# Patient Record
Sex: Female | Born: 1980 | Race: Black or African American | Hispanic: No | Marital: Single
Health system: Midwestern US, Community
[De-identification: ages and names within clinical notes are randomized; demographics above are authoritative.]

## PROBLEM LIST (undated history)

## (undated) DIAGNOSIS — O4700 False labor before 37 completed weeks of gestation, unspecified trimester: Secondary | ICD-10-CM

## (undated) DIAGNOSIS — B009 Herpesviral infection, unspecified: Secondary | ICD-10-CM

## (undated) DIAGNOSIS — A539 Syphilis, unspecified: Secondary | ICD-10-CM

## (undated) DIAGNOSIS — M25561 Pain in right knee: Secondary | ICD-10-CM

## (undated) HISTORY — PX: TUBAL LIGATION: SHX77

## (undated) HISTORY — PX: CARPAL TUNNEL RELEASE: SHX101

---

## 2004-02-29 ENCOUNTER — Emergency Department (HOSPITAL_COMMUNITY): Admission: EM | Admit: 2004-02-29 | Discharge: 2004-02-29 | Payer: Self-pay | Admitting: Emergency Medicine

## 2005-08-18 ENCOUNTER — Emergency Department (HOSPITAL_COMMUNITY): Admission: EM | Admit: 2005-08-18 | Discharge: 2005-08-18 | Payer: Self-pay | Admitting: Family Medicine

## 2005-09-25 ENCOUNTER — Emergency Department (HOSPITAL_COMMUNITY): Admission: EM | Admit: 2005-09-25 | Discharge: 2005-09-25 | Payer: Self-pay | Admitting: Family Medicine

## 2006-05-22 ENCOUNTER — Emergency Department (HOSPITAL_COMMUNITY): Admission: EM | Admit: 2006-05-22 | Discharge: 2006-05-22 | Payer: Self-pay | Admitting: Family Medicine

## 2006-08-11 ENCOUNTER — Emergency Department (HOSPITAL_COMMUNITY): Admission: EM | Admit: 2006-08-11 | Discharge: 2006-08-11 | Payer: Self-pay | Admitting: Family Medicine

## 2006-08-18 ENCOUNTER — Emergency Department (HOSPITAL_COMMUNITY): Admission: EM | Admit: 2006-08-18 | Discharge: 2006-08-18 | Payer: Self-pay | Admitting: Family Medicine

## 2006-10-07 ENCOUNTER — Emergency Department (HOSPITAL_COMMUNITY): Admission: EM | Admit: 2006-10-07 | Discharge: 2006-10-08 | Payer: Self-pay | Admitting: Emergency Medicine

## 2007-04-15 ENCOUNTER — Inpatient Hospital Stay (HOSPITAL_COMMUNITY): Admission: AD | Admit: 2007-04-15 | Discharge: 2007-04-15 | Payer: Self-pay | Admitting: Obstetrics

## 2007-05-29 ENCOUNTER — Inpatient Hospital Stay (HOSPITAL_COMMUNITY): Admission: RE | Admit: 2007-05-29 | Discharge: 2007-05-31 | Payer: Self-pay | Admitting: Obstetrics

## 2008-02-05 IMAGING — US US OB COMP LESS 14 WK
1 series · 14 of 28 positions shown · non-contrast
Comparison: None.

CLINICAL DATA: Abdominal pain and positive pregnancy test.
 OBSTETRICAL ULTRASOUND <14 WKS AND TRANSVAGINAL OB US:
TECHNIQUE: Both transabdominal and transvaginal ultrasound examinations were performed for complete evaluation of the gestation as well as the maternal uterus, adnexal regions, and pelvic cul-de-sac.

[Series 1: unknown · 0.32mm/px · 14 of 42 slices shown]
[im 2/42]
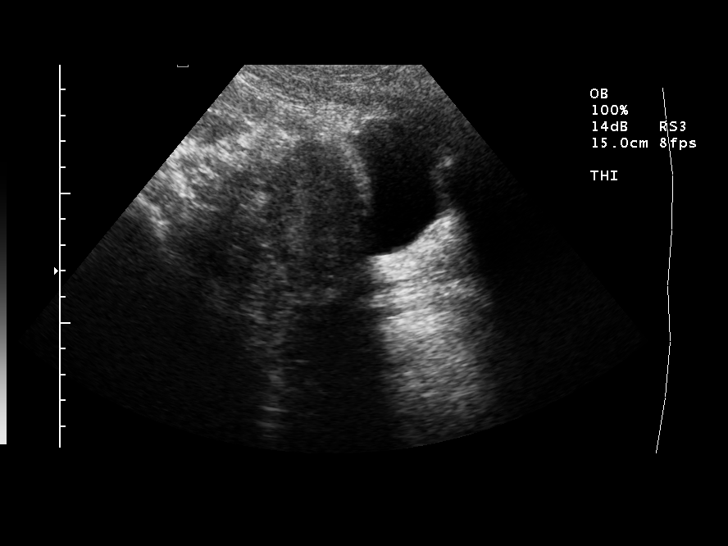
[im 5/42]
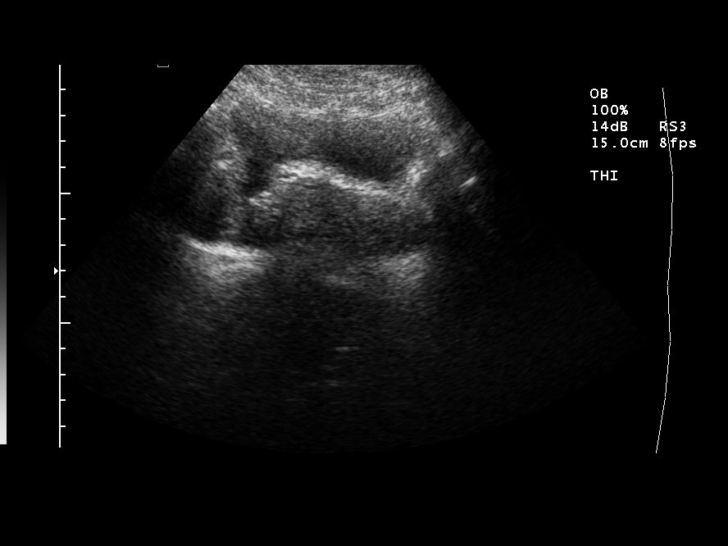
[im 8/42]
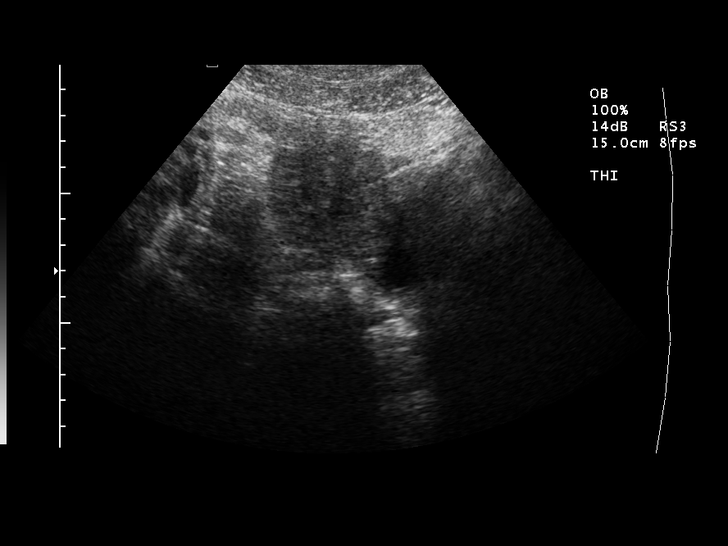
[im 11/42]
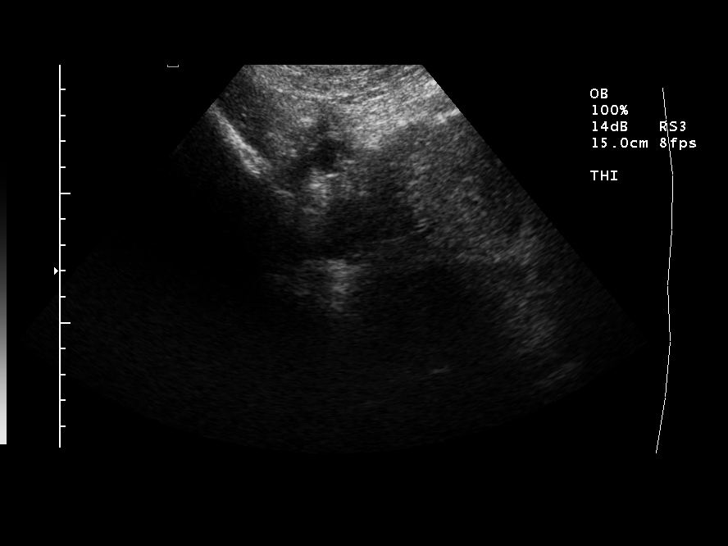
[im 14/42]
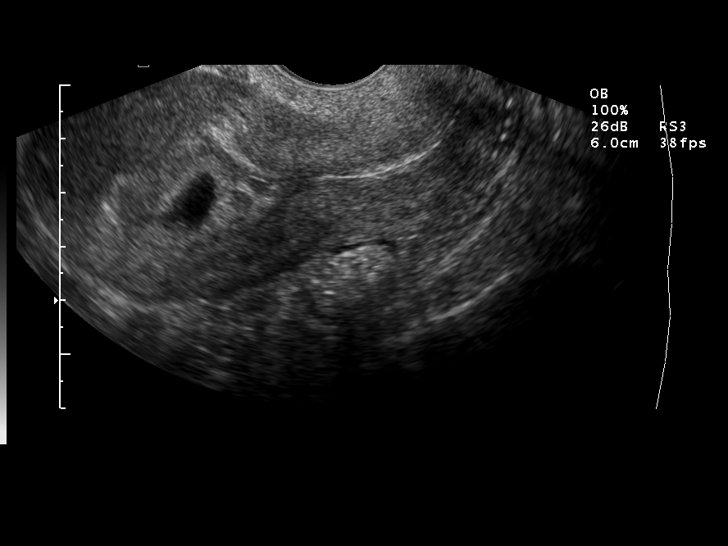
[im 17/42]
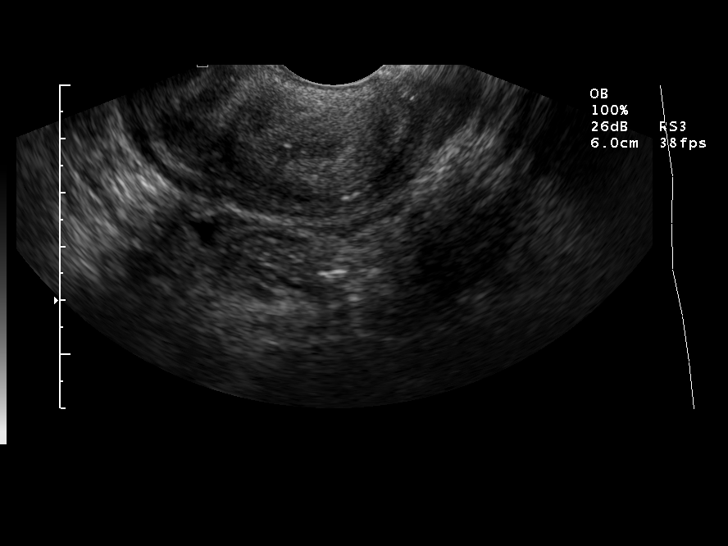
[im 20/42]
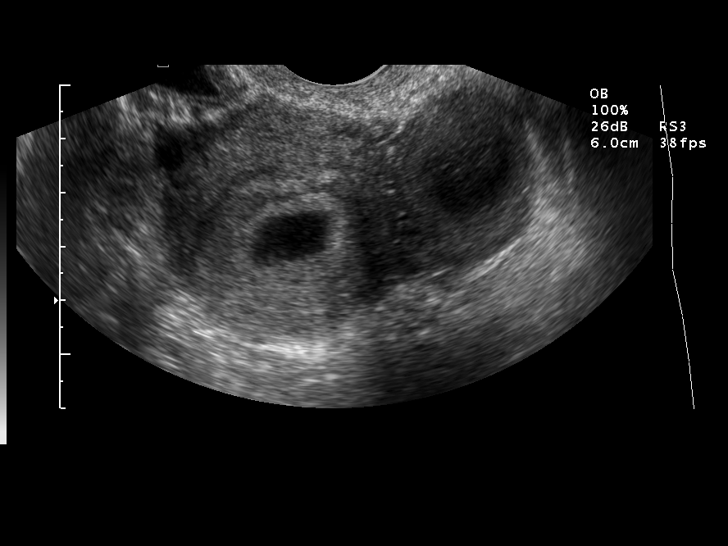
[im 23/42]
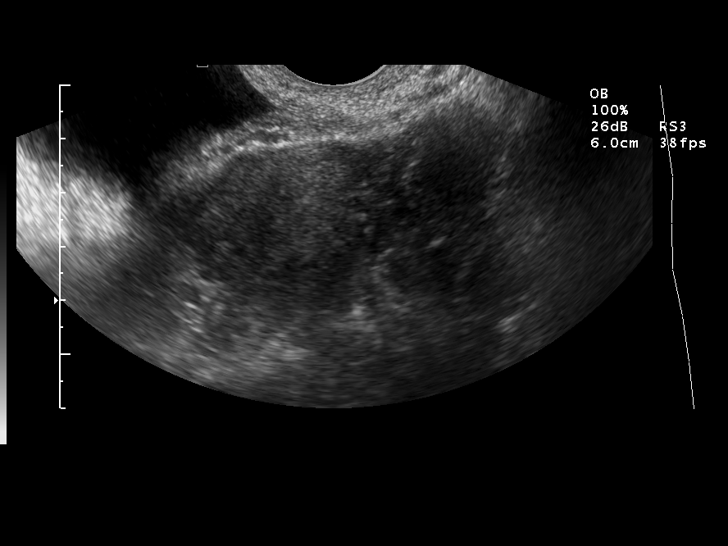
[im 26/42]
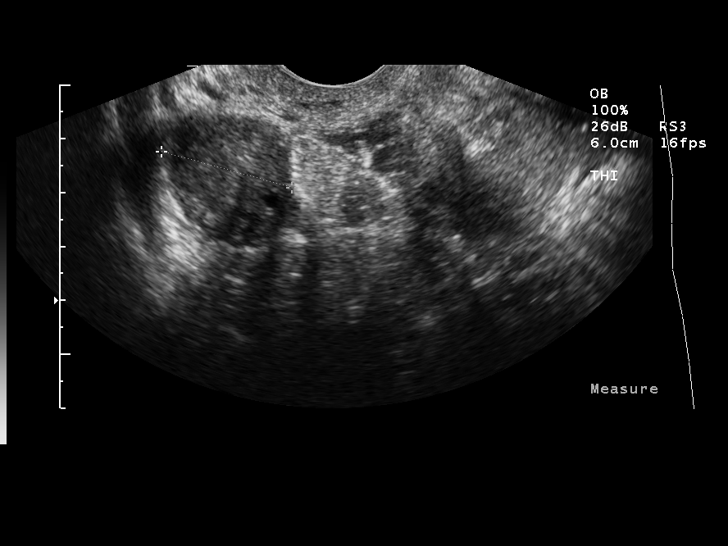
[im 29/42]
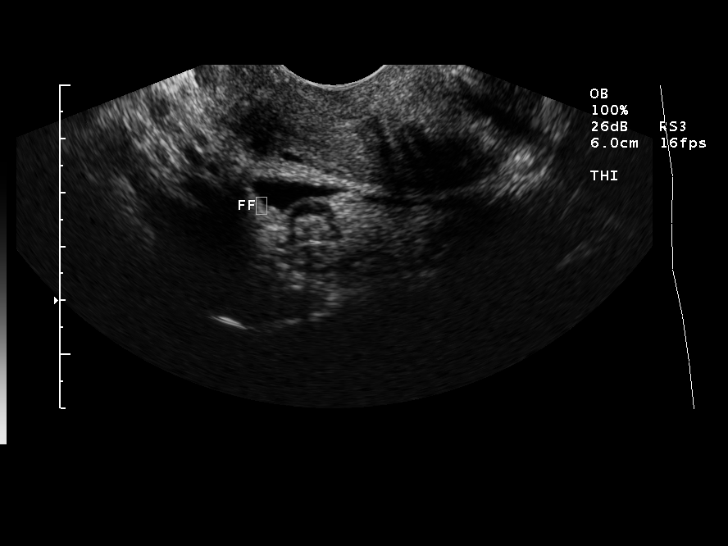
[im 32/42]
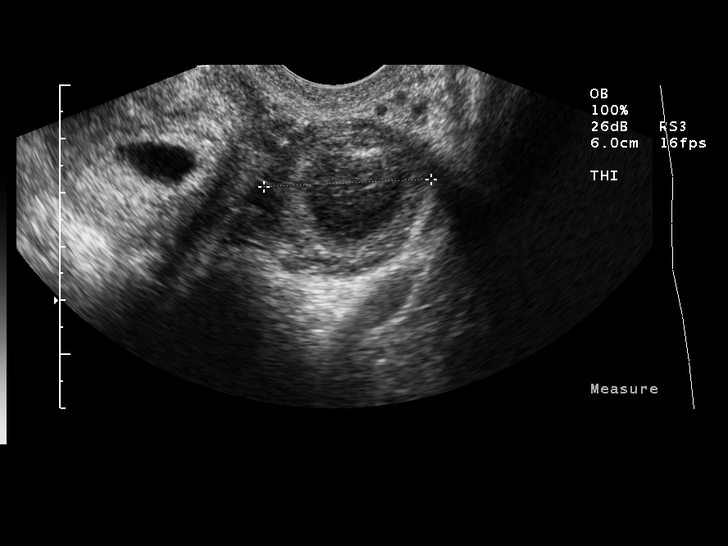
[im 35/42]
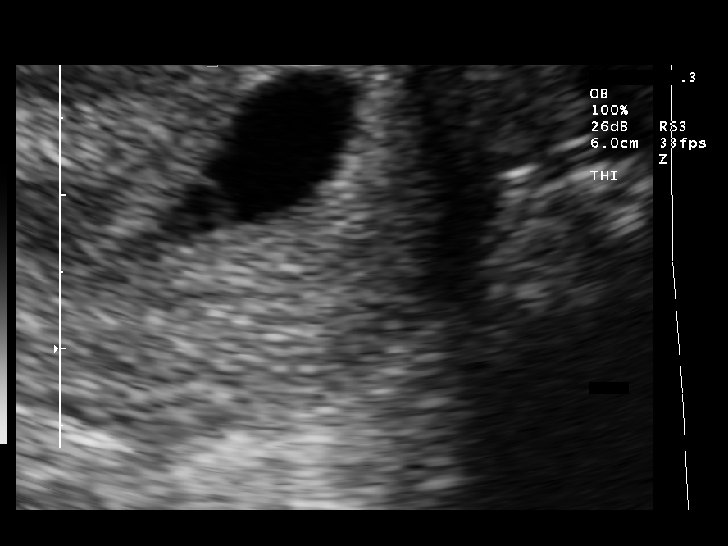
[im 38/42]
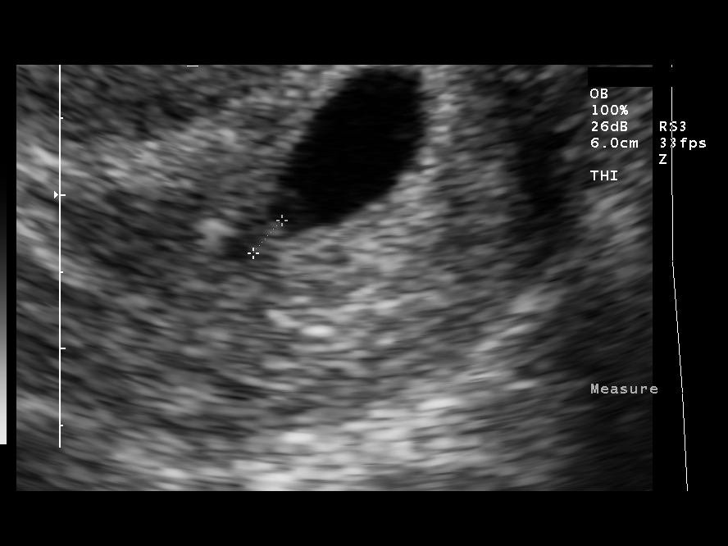
[im 42/42]
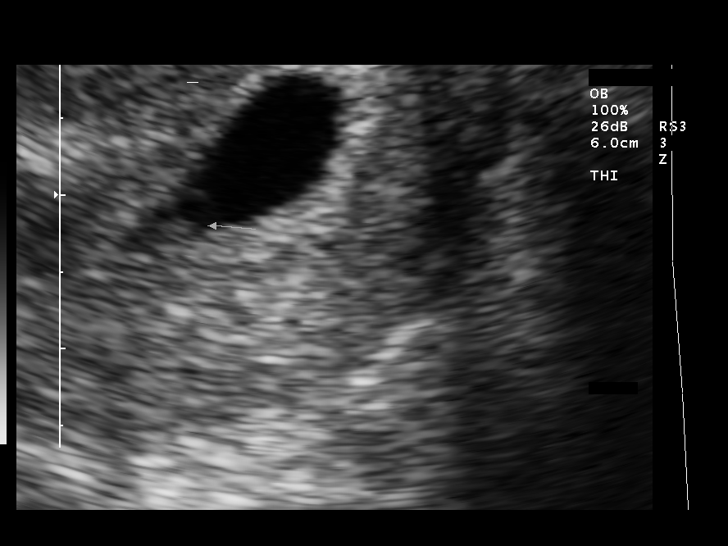

[14 of 28 positions shown; findings below may reference images not displayed]

FINDINGS: A single intrauterine gestational sac is identified.  A yolk sac and embryo are apparent.  Embryonic heart rate is measured at 111 bpm.  Crown-rump length measures 2.9 mm, which estimates a 5 week 6 day gestational age.  No definite evidence for subchorionic hemorrhage.  
 The right ovary is unremarkable.  A 2.5 cm complex cystic lesion is identified in the left ovary, probably a hemorrhagic cyst or endometrioma.  The patient does have a small amount of fluid in the cul-de-sac.
IMPRESSION: 1.  Single living intrauterine gestation at 5 week 6 estimated gestational age by crown-rump length.  
 2.  2.5 cm cystic lesion in the left ovary.  Follow-up ultrasound in 6-8 weeks is recommended to reevaluate.

## 2008-06-09 LAB — CULTURE, WOUND W GRAM STAIN

## 2008-06-11 LAB — CULTURE, WOUND W GRAM STAIN: Culture result:: NORMAL

## 2009-02-03 MED ORDER — ERYTHROMYCIN 5 MG/G EYE OINTMENT
5 mg/gram (0. %) | Freq: Three times a day (TID) | OPHTHALMIC | Status: AC
Start: 2009-02-03 — End: 2009-02-13

## 2009-02-03 NOTE — ED Notes (Signed)
Patient states swelling started yesterday, no other symptoms. This morning R eyelid swelling, burning, itching, and painful.

## 2009-02-03 NOTE — ED Notes (Signed)
Patient states she has allergies and often wakes in the morning with her eyes caked shut.

## 2009-02-03 NOTE — ED Provider Notes (Signed)
Patient is a 28 y.o. female presenting with eye pain. The history is provided by the patient (Woke up this morning and right eye swollen and sore.  Denies injury.). No language interpreter was used.   Eye Pain   This is a new problem. The current episode started 1 to 2 hours ago. The problem occurs constantly. The problem has not changed since onset. There is pain in the right eye. The injury mechanism was none. The pain is at a severity of 0/10. The patient is experiencing no pain. There is no history of trauma to the eye. There is no known exposure to pink eye. She does not wear contacts. Pertinent negatives include no numbness, no blurred vision, no decreased vision, no discharge, no double vision, no foreign body sensation, no photophobia, no eye redness, no nausea, no vomiting, no tingling, no weakness, no itching, no fever, no pain, no blindness, no Head Injury and no dizziness. She has tried nothing for the symptoms.        Past Medical History   Diagnosis Date   ??? Psychiatric disorder           No past surgical history on file.      No family history on file.     History   Social History   ??? Marital Status: Single     Spouse Name: N/A     Number of Children: N/A   ??? Years of Education: N/A   Occupational History   ??? Not on file.   Social History Main Topics   ??? Tobacco Use: Never   ??? Alcohol Use: No   ??? Drug Use: Yes   ??? Sexually Active:    Other Topics Concern   ??? Not on file   Social History Narrative   ??? No narrative on file           ALLERGIES: Review of patient's allergies indicates not on file.      Review of Systems   Constitutional: Negative for fever.   Eyes: Negative for blindness, blurred vision, double vision, photophobia, pain, discharge and redness.   Gastrointestinal: Negative for nausea and vomiting.   Skin: Negative for itching.   Neurological: Negative for dizziness, tingling, weakness and numbness.   All other systems reviewed and are negative.        Filed Vitals:    02/03/2009  7:39 AM    BP: 137/88   Pulse: 78   Temp: 96.5 ??F (35.8 ??C)   Resp: 16   Height: 5\' 4"  (1.626 m)   SpO2: 99%              Physical Exam   Nursing note and vitals reviewed.  Constitutional: She is oriented to person, place, and time. She appears well-developed and well-nourished. No distress.   HENT:   Head: Normocephalic and atraumatic.   Right Ear: External ear normal.   Left Ear: External ear normal.   Mouth/Throat: Oropharynx is clear and moist. No oropharyngeal exudate.   Eyes: Conjunctivae and extraocular motions are normal. Pupils are equal, round, and reactive to light. Right eye exhibits no discharge. Left eye exhibits no discharge. No scleral icterus.        Right eye;  Mild edema medial aspect upper lid with stye formation present without discharge, exudate, tenderness.   Neck: Normal range of motion. Neck supple. No JVD present. No tracheal deviation present. No thyromegaly present.   Cardiovascular: Normal rate, regular rhythm and normal heart sounds.  No murmur heard.  Pulmonary/Chest: Effort normal and breath sounds normal. No respiratory distress. She has no wheezes. She has no rales. She exhibits no tenderness.   Abdominal: Soft. Bowel sounds are normal. She exhibits no distension and no mass. No tenderness. She has no rebound and no guarding.   Musculoskeletal: Normal range of motion. She exhibits no edema and no tenderness.   Lymphadenopathy:     She has no cervical adenopathy.   Neurological: She is alert and oriented to person, place, and time. No cranial nerve deficit. Coordination normal.   Skin: Skin is warm. No rash noted. She is not diaphoretic. No erythema. No pallor.   Psychiatric: She has a normal mood and affect. Her behavior is normal. Judgment and thought content normal.        Coding    Procedures

## 2009-02-03 NOTE — ED Notes (Signed)
Patient states understanding of discharge instructions. Patient was ambulatory upon discharge. Patient received one prescription.

## 2009-02-03 NOTE — ED Notes (Signed)
Per patient, right eye burning, itching and swelling for an unknown period of time

## 2009-03-15 ENCOUNTER — Encounter

## 2009-03-15 NOTE — Progress Notes (Signed)
[facility]  [address]  [place], [place]  [address]    OUTPATIENT physical Therapy  [x]        Initial Evaluation []       30 day/10th visit progress note []       90 day/re-certification    NAME: Sarah Hood AGE: 28 y.o.  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 She was previously working for [employer]. Most of her jobs include lifting and bending-which aggravate her LBP.  She was workig at [employer] until 1 month ago when she started having problems with her R knee. Present Symptoms: Functional Deficits and Limitations:   [x]       Sitting:30 min.   [x]      Dressing:Her daughter  []      Reaching:  [x]       Standing:30 min.   []       Bathing:   []      Lifting:  []       Walking:   [x]       Cooking:   []      Yardwork:  [x]       Sleeping:   [x]       Cleaning:making up her bed_    Driving:  []       Work Tasks:  []       Recreation:   []      Other:  Past Medical History: Past Medical History   Diagnosis Date   ??? Psychiatric disorder        No past surgical history on file.  Precautions:   Current Medications:  Trazodone, Zoloft, Prozac and Lamictal.    Social/Work History and Prior Level of Function:  Lives with her 3 children  Previous Therapy:    SUBJECTIVE:   My back really hurts me when I have to carry my 20 month old daughter around.  Patient???s goals for therapy: ???She wants the pain to go away so she can work. ???    OBJECTIVE DATA SUMMARY:   Pain:  She rates her cervical and thoracic pain as a 9/10 before the treatment. Following treatment she rated her pain as a 6/10. Lumbar flexion was limited by pain.              Posture: She reports that the MD says she has arthritis. She stands with an increased lumbar lordosis and very poor abdominal tone.  Poor abdominal tone with decreased WB on her Rt  Knee joint which is swollen today; slightly increased lumbar lordosis     Strength:                        LEs tested strong through out; cautious with resistance to her R knee joint due to pain  Range of Motion:                          Spinal Assessment: Spine Assessment (WDL): Within defined limits        Lumbo-Sacral Spine Assessment  Lumbo-Sacral Flexion (% of Normal): 60   Lumbo-Sacral Extension (% of Normal): 30  Lumbo-Sacral Right Rotation (% of Normal): 30   Lumbo-Sacral Left Rotation (% of Normal): 39     Joint Mobility: limited lumbar rotation to the R at approx. L3  Palpation: muscle guarding over her R rotatores at the above segment; also guarding along the R erector spinae in the thoracic region    Neurologic Assessment:   Tone: increased turgor In R para spinals  Sensation: No numbness reported Reflexes: S1 intact and symmetrical.    Mobility:   Transitional Movements: Gait: decreased lengthened step on her RLE due to knee pain  Balance: NA  TREATMENT/INTERVENTION:  Modalities:   MHP applied to her R thoracic and lumbar region x 15 min in L sidelying. Followed by [place] to the R rotatores at 1.3 w/cm2 100% for 8 min.  Therapeutic Exercises:   Began quadriped side bending and pelvic tilts.          Manual Therapy:  Grad II mobs for L rotation at L3 on L4  Neuro Re-Education:      Balance Training:      Ambulation/Gait Training:      Activity tolerance and post treatment pain report:   Good    Education:  [x]       Home exercise program provided.  Education was provided to the patient on the following topics: modification of lifting her 9 month old child  Patient verbalized understanding of the topics presented.    ASSESSMENT:    Sarah Hood is a 29 y.o. female who presents with  R sided lumbar and thoracic pain after a epidural following the birth of her last child.  Physical therapy problems based on objective data include:  Limited lumbar flexion, poor abdominal tone, pain with lifting her 31 month old child, and muscle guarding over her R lumbar\\thoracic parspinals.  Patient will benefit from skilled intervention to address these impairments.     Rehabilitation potential is considered to be Fair.  Factors which may influence rehabilitation potential include caring for her 3 children.  Patient will benefit from 8 physical therapy visits over 4-12 weeks to optimize improvement in these areas.    PLAN OF CARE:   Recommendations and Planned Interventions:  []       Therapeutic Activities  []       Heat/Ice  []       Therapeutic Exercises  []       Ultrasound  []       Gait training  []       E-stim  []       Balance training  []       Home exercise program  []       Manual Therapy  []       TENS  []       Neuro Re-Ed  []       Edema management  []       Posture/Biomechanics  []       Pain management  []       Traction  []       Other:    Frequency/Duration:  Patient will be followed by physical therapy 2 times a week for  4-8 weeks to address goals.      GOALS  STG's initiated 03/15/2009  1. Patient will have increased lumbar  ROM in within 4 weeks.  2. Patient will have decreased muscle guarding over her R rotatores muscle within 2 weeks.  3. Patient will demonstrate improved postural alignment  Within 1 week.  4. Patient's subjective pain rating will decrease from 9 to 4 within  2 weeks.  5. Patient will sit for 12 min with minimal discomfort within 2 weeks.  6. Patient will be able to sleep for 4 hours without being awakened by pain in 3 weeks.    LTG's initiated 03/15/2009  1. Patient will be independent with personalized home exercise program and self pain management within 4 weeks.   2. Patient will be able to resume all prior level activities within 4 weeks.  3. Patient will demonstrate proper body mechanics with picking up her daughter within 4 weeks.   .     [x]       Patient has participated in goal setting and agrees to work toward plan of care.  [x]       Patient was instructed to call if questions or concerns arise.    Thank you for this referral. Add UEs with recumbent bicycle next visit.     Roxy Horseman, PT       TREATMENT PLAN EFFECTIVE DATES:   03/15/2009 TO 06/14/09  I have read the above plan of care for Sarah Hood and certify the need for skilled physical therapy services.    Physician Signature: ____________________________________________________    Date: _________________________________________________________________

## 2009-03-17 NOTE — Progress Notes (Signed)
[facility]  [address]  [place], [place]  [address]    OUTPATIENT physical Therapy      03/17/2009:  Sarah Hood was not seen on this date for physical therapy for the following reasons:    []    Patient called to cancel the visit for the following reasons:[x]    Patient missed the visit and did not call to cancel.    Roxy Horseman, PT

## 2009-05-22 NOTE — Progress Notes (Signed)
[facility]  [address]  [place], [place]  [address]    OUTPATIENT physical Therapy  Discharge note      05/22/2009:  Patient will be discharged from physical therapy at this time.  Criteria for termination of care:  []  Goals Achieved  []  Plateau Reached  [x]  Patient has not returned to therapy  []  Patient has missed three or more visits without prior notification  []  Other:     Patient has been seen for inital evaluation only on 03/15/09.  Please refer to the initial eval for the only PT data on this patient    Thank you for this referral.  Kathi Der, PT

## 2011-01-19 LAB — EKG, 12 LEAD, INITIAL
Atrial Rate: 83 {beats}/min
Calculated P Axis: 38 degrees
Calculated R Axis: 14 degrees
Calculated T Axis: 10 degrees
Diagnosis: NORMAL
P-R Interval: 148 ms
Q-T Interval: 386 ms
QRS Duration: 86 ms
QTC Calculation (Bezet): 453 ms
Ventricular Rate: 83 {beats}/min

## 2011-01-19 NOTE — ED Notes (Signed)
Pt c/o chest pain that started this am pain worse with movement

## 2011-04-11 LAB — CBC
HCT: 30 — ABNORMAL LOW
Hemoglobin: 10.6 — ABNORMAL LOW
MCHC: 34.6
MCHC: 35.3
MCV: 95.9
Platelets: 174
Platelets: 206
RDW: 13.4

## 2011-04-13 LAB — URINALYSIS, ROUTINE W REFLEX MICROSCOPIC
Ketones, ur: 15 — AB
Nitrite: NEGATIVE
Protein, ur: NEGATIVE
Urobilinogen, UA: 0.2

## 2011-04-13 LAB — URINE MICROSCOPIC-ADD ON

## 2011-09-01 NOTE — Telephone Encounter (Signed)
Message copied by Catalina Gravel on Fri Sep 01, 2011 11:59 AM  ------       Message from: Natasha Bence P       Created: Thu Aug 31, 2011 12:46 PM       Regarding: [email or web address]                       ----- Message -----          From: Rolland Bimler          Sent: 08/31/2011  12:17 PM            To: [employer] Front Office Pool       Subject: Dr. Demetrios Isaacs Telephone                                     No appointment available. Patient would like to see if she can come in sooner then May as new patient. Patient stated she was diagnosed with breast cancer 3 months ago and the Doctor has not seen her since then. Best contact number for patient is [phone].

## 2011-09-01 NOTE — Telephone Encounter (Signed)
Printed for review.

## 2011-09-01 NOTE — Telephone Encounter (Signed)
Left msg for pt that we do not have any closer available appt for New Patients and to call dr who dxed her with breast cancer to schedule an f/u with them before she comes here.Marland Kitchen

## 2011-09-11 NOTE — Telephone Encounter (Signed)
Pt need to be seen if she has any concern based on available appointment otherwise need to be seen in ER there is no hx of abnl mammogram in the [facility] in this pt

## 2012-11-07 NOTE — Progress Notes (Signed)
@  2255 Spoke to Dr. Yetta Barre who called to check on patient. Informed him of recent blood pressures. States he does not want labs drawn at this time. Keep patient for one hour and if pressures are still ok she can be discharged home.

## 2012-11-08 ENCOUNTER — Inpatient Hospital Stay: Payer: MEDICAID

## 2012-11-08 LAB — CBC W/O DIFF
HCT: 29 % — ABNORMAL LOW (ref 35.0–47.0)
HGB: 10.1 g/dL — ABNORMAL LOW (ref 11.5–16.0)
MCH: 31.4 PG (ref 26.0–34.0)
MCHC: 34.8 g/dL (ref 30.0–36.5)
MCV: 90.1 FL (ref 80.0–99.0)
PLATELET: 182 10*3/uL (ref 150–400)
RBC: 3.22 M/uL — ABNORMAL LOW (ref 3.80–5.20)
RDW: 13 % (ref 11.5–14.5)
WBC: 9.3 10*3/uL (ref 3.6–11.0)

## 2012-11-08 MED ADMIN — dextrose 5% lactated ringers infusion: INTRAVENOUS | @ 12:00:00 | NDC 00409792909

## 2012-11-08 MED ADMIN — dextrose 5% lactated ringers infusion: INTRAVENOUS | @ 05:00:00 | NDC 00409792909

## 2012-11-08 NOTE — Progress Notes (Addendum)
Bedside shift change report given to Montey Hora RN (oncoming nurse) by Herbert Seta Case RN (offgoing nurse).  Report given with SBAR, Kardex and MAR.     0813 pt sitting up for reflex check, fetal strip appears to show deceleration but it is only from maternal movement sitting up.    8657 dr Yetta Barre on unit, states pt may have a regular diet.     1500 pt discharge instructions reviewed and follow up instructions as well . Pt states understanding

## 2012-11-08 NOTE — Progress Notes (Signed)
NUTRITION       Nutrition screening referral was triggered based on results obtained during nursing admission assessment.  The patient's chart was reviewed and nutrition assessment is not indicated at this time.  Plan to see patient for rescreen as indicated.  Thank you.     EMILY L KLINE, RD

## 2012-11-08 NOTE — H&P (Signed)
History & Physical    Name: Sarah Hood MRN: [id number]  SSN: [id number]    Date of Birth: 09-Nov-1980  Age: 32 y.o.  Sex: female      Subjective:     Estimated Date of Delivery: 12/17/12  OB History    Grav Para Term Preterm Abortions TAB SAB Ect Mult Living    4 3 1 2  0 0 0 0 0 3          Sarah Hood is a 31 Y/O BF G4P3 evaluted for current pregnancy at [redacted]w[redacted]d for elevated BP and Ctx. Patient states that her BP was elevated per her home monitor but denies HA or visual changes. Also states that her placenta per an office [place] was noted to be "old" and that she has an appointment with MFM to repeat the [place] on 11-12-12.    Past Medical History   Diagnosis Date   ??? Essential hypertension      with preganacy only   ??? Anemia    ??? Psychiatric disorder      bipolar   ??? Chlamydia    ??? Abnormal Pap smear      Past Surgical History   Procedure Laterality Date   ??? Hx other surgical       cervical dysplasia freezing     Social History     Occupational History   ??? Not on file.     Social History Main Topics   ??? Smoking status: Current Every Day Smoker -- 0.25 packs/day for 16 years   ??? Smokeless tobacco: Not on file   ??? Alcohol Use: Yes   ??? Drug Use: Yes     Special: Marijuana      Comment: former   ??? Sexually Active: No     Family History   Problem Relation Age of Onset   ??? Elevated Lipids Mother    ??? Hypertension Mother    ??? Headache Mother    ??? Migraines Mother    ??? Alcohol abuse Father        No Known Allergies  Prior to Admission medications    Medication Sig Start Date End Date Taking? Authorizing Provider   traZODone (DESYREL) 100 mg tablet Take 100 mg by mouth nightly.      Phys Other, MD   FLUoxetine (PROZAC) 20 mg capsule Take  by mouth daily.      Phys Other, MD   Lamotrigine (LAMICTAL XR) 50 mg TR24 Take  by mouth.      Phys Other, MD        Review of Systems: See HPI    Objective:     Vitals:  Filed Vitals:    11/07/12 2259 11/07/12 2304 11/07/12 2309 11/07/12 2314   BP: 124/73 119/76 116/68 117/68   Pulse: 95 96  92 96   Temp:       Resp:       Height:       Weight:            Physical Exam:  Abdomen soft NT gravid uterus  Membranes intact  Fetal Heart Rate tracing non reactive; moderate variability  Cx 1 50%  Ctx irregular       Prenatal Labs:   No results found for this basename: OBEXTABORH, OBExtAbscrn, OBExtRubella, OBExtGrBS, OBExtHBsAg, OBExtHIV, OBExtRPR, OBExtGonorr, OBExtChlam        Impression/Plan:   Imp:IUP [redacted]w[redacted]d         PTC  FHT tracing NR    Plan: IV hydration            ?  BPP in am    Signed By:  Dianna Rossetti, MD     Nov 08, 2012

## 2012-11-08 NOTE — Progress Notes (Addendum)
2310~  Pt arrived after taking several blood pressures at home and being concerned.  States she has some swelling in her legs.  Has had a headache off and on with 'floaters'.  Here to rule out hypertension  2315~  Call placed to Dr Yetta Barre, for orders.  2350~  Dr Lafonda Mosses in to see pt and view tracing.  SVE  0805~  Bedside and Verbal shift change report given to L Perrigan RN (oncoming nurse) by H Case RN (offgoing nurse).  Report given with SBAR, MAR and Recent Results.

## 2012-11-09 LAB — TYPE AND SCREEN
ABO/Rh: O POS
Antibody Screen: NEGATIVE

## 2012-11-09 LAB — TYPE & SCREEN
ABO/Rh(D): O POS
Antibody screen: NEGATIVE

## 2014-05-22 ENCOUNTER — Ambulatory Visit
Admit: 2014-05-22 | Discharge: 2014-05-22 | Payer: PRIVATE HEALTH INSURANCE | Attending: Family Medicine | Primary: Family Medicine

## 2014-05-22 DIAGNOSIS — Z23 Encounter for immunization: Secondary | ICD-10-CM

## 2014-05-22 MED ORDER — ACETAMINOPHEN-ISOMETHEPTENE-DICHLORAL 325 MG-65 MG-100 MG CAP
65-100-325 mg | ORAL_CAPSULE | Freq: Four times a day (QID) | ORAL | Status: DC | PRN
Start: 2014-05-22 — End: 2014-06-03

## 2014-05-22 MED ORDER — LAMOTRIGINE 25 MG TAB
25 mg | ORAL_TABLET | Freq: Every day | ORAL | Status: AC
Start: 2014-05-22 — End: ?

## 2014-05-22 MED ORDER — TRAZODONE 100 MG TAB
100 mg | ORAL_TABLET | Freq: Every evening | ORAL | Status: AC
Start: 2014-05-22 — End: ?

## 2014-05-22 MED ORDER — ACYCLOVIR 400 MG TAB
400 mg | ORAL_TABLET | Freq: Two times a day (BID) | ORAL | Status: AC
Start: 2014-05-22 — End: ?

## 2014-05-22 MED ORDER — TRIAMCINOLONE ACETONIDE 0.1 % TOPICAL CREAM
0.1 % | Freq: Two times a day (BID) | CUTANEOUS | Status: AC
Start: 2014-05-22 — End: ?

## 2014-05-22 MED ORDER — SERTRALINE 25 MG TAB
25 mg | ORAL_TABLET | Freq: Every day | ORAL | Status: AC
Start: 2014-05-22 — End: ?

## 2014-05-22 MED ORDER — METRONIDAZOLE 500 MG TAB
500 mg | ORAL_TABLET | Freq: Two times a day (BID) | ORAL | Status: AC
Start: 2014-05-22 — End: 2014-05-29

## 2014-05-22 NOTE — Progress Notes (Signed)
Sarah Hood is a 33 y.o. female and presents with Complete Physical and Vaginal Discharge  .  New pt  Has a h/o bipolar depression. Was on Lamictal 50mg  every day., trazodone 100mg  qd and Zoloft 25mg  qd but has been off for a few months since her recent pregnancy. She is 1 year postpartum.     Pt c/o itchy rash that started a week ago after going to someone's house. No sick contacts. No OTC meds. +h/o eczema.     C/o malodorous discharge x 3-4 days. Stopped breastfeeding 8 months ago. No itch. No OTC. +unprotected sex. +h/o HSV. Last outbreak was 1 yr ago.     Pt has a h/o chronic headaches which occur daily and have been occurring for years. HA is frontal. +nausea. No vomiting. +neck radiation. Sees rainbow spots with HA. No photophobia or phonophobia. Previously had taken Midrin.       Review of Systems  Constitutional: negative for fevers, chills, anorexia and weight loss  Eyes:   negative for visual disturbance and irritation  ENT:   negative for tinnitus,sore throat,nasal congestion,ear pains.hoarseness  Respiratory:  negative for cough, hemoptysis, dyspnea,wheezing  CV:   negative for chest pain, palpitations, lower extremity edema  GI:   negative for nausea, vomiting, diarrhea, abdominal pain,melena  Endo:               negative for polyuria,polydipsia,polyphagia,heat intolerance  Genitourinary: negative for frequency, dysuria and hematuria  Integument:  positive for rash and pruritus  Hematologic:  negative for easy bruising and gum/nose bleeding  Musculoskel: negative for myalgias, arthralgias, back pain, muscle weakness, joint pain  Neurological:  negative fordizziness, vertigo, memory problems and gait   Behavl/Psych: negative for feelings of anxiety, depression, mood changes    Past Medical History   Diagnosis Date   ??? Bipolar depression (HCC)      History reviewed. No pertinent past surgical history.  History     Social History   ??? Marital Status: SINGLE     Spouse Name: N/A      Number of Children: N/A   ??? Years of Education: N/A     Social History Main Topics   ??? Smoking status: Never Smoker    ??? Smokeless tobacco: Not on file   ??? Alcohol Use: No   ??? Drug Use: Yes   ??? Sexual Activity: Not on file     Other Topics Concern   ??? Not on file     Social History Narrative     Current Outpatient Prescriptions   Medication Sig Dispense Refill   ??? acyclovir (ZOVIRAX) 400 mg tablet Take 1 Tab by mouth two (2) times a day. 60 Tab 11   ??? metroNIDAZOLE (FLAGYL) 500 mg tablet Take 1 Tab by mouth two (2) times a day for 7 days. 14 Tab 0   ??? lamoTRIgine (LAMICTAL) 25 mg tablet Take 2 Tabs by mouth daily. 60 Tab 5   ??? traZODone (DESYREL) 100 mg tablet Take 1 Tab by mouth nightly. 30 Tab 5   ??? sertraline (ZOLOFT) 25 mg tablet Take 1 Tab by mouth daily. 30 Tab 5   ??? triamcinolone acetonide (KENALOG) 0.1 % topical cream Apply  to affected area two (2) times a day. Use as needed for rash. 45 g 5   ??? isometh-dichloral-acetaminophn (MIDRIN) 65-100-325 mg capsule Take 1 Cap by mouth four (4) times daily as needed for Migraine. Max Daily Amount: 4 Caps. 45 Cap 2     No  Known Allergies    Objective:  BP 120/85 mmHg   Pulse 105   Temp(Src) 98.3 ??F (36.8 ??C) (Oral)   Resp 20   Ht 5\' 4"  (1.626 m)   Wt 169 lb 3.2 oz (76.749 kg)   BMI 29.03 kg/m2   SpO2 100%  Physical Exam:   General appearance - alert, well appearing, and in no distress  Mental status - alert, oriented to person, place, and time  Chest - clear to auscultation, no wheezes, rales or rhonchi, symmetric air entry   Heart - normal rate, regular rhythm, normal S1, S2, no murmurs, rubs, clicks or gallops   Abdomen - soft, nontender, nondistended, no masses or organomegaly  Lymph- no adenopathy palpable  Ext-peripheral pulses normal, no pedal edema, no clubbing or cyanosis  Skin-Warm and dry. no hyperpigmentation, vitiligo, or suspicious lesions  Neuro -alert, oriented, normal speech, no focal findings or movement disorder noted   Psych- very talkative and unfocused. Rambling from 1 topic to another      Results for orders placed or performed in visit on 05/22/14   NUSWAB VAGINITIS   Result Value Ref Range    Atopobium vaginae High - 2 (A) Score    BVAB 2 High - 2 (A) Score    Megasphaera 1 High - 2 (A) Score    C. albicans, NAA Positive (A) Negative    C. glabrata, NAA Negative Negative    T. vaginalis, NAA Positive (A) Negative       Assessment/Plan:    ICD-10-CM ICD-9-CM    1. Encounter for immunization Z23 V03.89 IMM ADMIN FLUZONE INTRADERMAL   2. Bipolar depression (HCC) F31.30 296.50 REFERRAL TO PSYCHIATRY      lamoTRIgine (LAMICTAL) 25 mg tablet      traZODone (DESYREL) 100 mg tablet      sertraline (ZOLOFT) 25 mg tablet   3. Well woman exam with routine gynecological exam Z01.419 V72.31 NUSWAB VAGINITIS    [V72.31]   4. Screening for malignant neoplasm of cervix Z12.4 V76.2 PAP, LIQUID BASED, IMAGE PROCESSED   5. HSV infection B00.9 054.9 acyclovir (ZOVIRAX) 400 mg tablet   6. Vaginitis N76.0 616.10 metroNIDAZOLE (FLAGYL) 500 mg tablet   7. Chronic tension-type headache, intractable G44.221 339.12 isometh-dichloral-acetaminophn (MIDRIN) 65-100-325 mg capsule   8. Eczema L30.9 692.9 triamcinolone acetonide (KENALOG) 0.1 % topical cream     Orders Placed This Encounter   ??? Influenza virus vaccine (FLUZONE INTRADERMAL PF) age 47-64   ??? NUSWAB VAGINITIS   ??? REFERRAL TO PSYCHIATRY     Referral Priority:  Routine     Referral Type:  Behavioral Health     Referral Reason:  Specialty Services Required   ??? acyclovir (ZOVIRAX) 400 mg tablet     Sig: Take 1 Tab by mouth two (2) times a day.     Dispense:  60 Tab     Refill:  11   ??? metroNIDAZOLE (FLAGYL) 500 mg tablet     Sig: Take 1 Tab by mouth two (2) times a day for 7 days.     Dispense:  14 Tab     Refill:  0   ??? lamoTRIgine (LAMICTAL) 25 mg tablet     Sig: Take 2 Tabs by mouth daily.     Dispense:  60 Tab     Refill:  5   ??? traZODone (DESYREL) 100 mg tablet      Sig: Take 1 Tab by mouth nightly.     Dispense:  30 Tab  Refill:  5   ??? sertraline (ZOLOFT) 25 mg tablet     Sig: Take 1 Tab by mouth daily.     Dispense:  30 Tab     Refill:  5   ??? triamcinolone acetonide (KENALOG) 0.1 % topical cream     Sig: Apply  to affected area two (2) times a day. Use as needed for rash.     Dispense:  45 g     Refill:  5   ??? isometh-dichloral-acetaminophn (MIDRIN) 65-100-325 mg capsule     Sig: Take 1 Cap by mouth four (4) times daily as needed for Migraine. Max Daily Amount: 4 Caps.     Dispense:  45 Cap     Refill:  2   ??? PAP, LIQUID BASED, IMAGE PROCESSED     Order Specific Question:  Pap Source?     Answer:  Vaginal     Order Specific Question:  Total Hysterectomy?     Answer:  No     Order Specific Question:  Supracervical Hysterectomy?     Answer:  No     Order Specific Question:  Post Menopausal?     Answer:  No     Order Specific Question:  Hormone Therapy?     Answer:  No     Order Specific Question:  IUD?     Answer:  No     Order Specific Question:  Abnormal Bleeding?     Answer:  No     Order Specific Question:  Pregnant     Answer:  No     Order Specific Question:  Post Partum?     Answer:  No     Vaginitis- Nuswab  Rash-resolved  Headaches likely linked to bipolar d/o- refilled midrin but warned against daily use  Bipolar d/o refilled psych meds. Told pt she needed to see psychiatry  Flu shot received    Follow-up Disposition:  Return if symptoms worsen or fail to improve.

## 2014-05-22 NOTE — Patient Instructions (Signed)
Bacterial Vaginosis: After Your Visit  Your Care Instructions     Bacterial vaginosis is a type of vaginal infection. It is caused by excess growth of certain bacteria that are normally found in the vagina. Symptoms can include itching, swelling, pain when you urinate or have sex, and a gray or yellow discharge with a "fishy" odor. It is not considered an infection that is spread through sexual contact.  Although symptoms can be annoying and uncomfortable, bacterial vaginosis does not usually cause other health problems. However, if you have it while you are pregnant, it can cause complications.  While the infection may go away on its own, most doctors use antibiotics to treat it. You may have been prescribed pills or vaginal cream. With treatment, bacterial vaginosis usually clears up in 5 to 7 days.  Follow-up care is a key part of your treatment and safety. Be sure to make and go to all appointments, and call your doctor if you are having problems. It's also a good idea to know your test results and keep a list of the medicines you take.  How can you care for yourself at home?  ?? Take your antibiotics as directed. Do not stop taking them just because you feel better. You need to take the full course of antibiotics.  ?? Do not eat or drink anything that contains alcohol if you are taking metronidazole (Flagyl).  ?? Keep using your medicine if you start your period. Use pads instead of tampons while using a vaginal cream or suppository. Tampons can absorb the medicine.  ?? Wear loose cotton clothing. Do not wear nylon and other materials that hold body heat and moisture close to the skin.  ?? Do not scratch. Relieve itching with a cold pack or a cool bath.  ?? Do not wash your vaginal area more than once a day. Use plain water or a mild, unscented soap. Do not douche.  When should you call for help?  Watch closely for changes in your health, and be sure to contact your doctor if:   ?? You have unexpected vaginal bleeding.  ?? You have a fever.  ?? You have new or increased pain in your vagina or pelvis.  ?? You are not getting better after 1 week.   Where can you learn more?   Go to http://www.healthwise.net/[facility]  Enter X360 in the search box to learn more about "Bacterial Vaginosis: After Your Visit."   ?? 2006-2015 Healthwise[employer] Inc[employer]. Care instructions adapted under license by [place] (which disclaims liability or warranty for this information). This care instruction is for use with your licensed healthcare professional. If you have questions about a medical condition or this instruction, always ask your healthcare professional. Healthwise, Incorporated disclaims any warranty or liability for your use of this information.  Content Version: 10.5.422740; Current as of: August 22, 2013

## 2014-05-22 NOTE — Progress Notes (Signed)
1. Have you been to the ER, urgent care clinic since your last visit?  Hospitalized since your last visit?No.    2. Have you seen or consulted any other health care providers outside of the [facility] since your last visit?  Include any pap smears or colon screening. No.  Have not had anything for pain.  Sarah SeltzerLatausha Hrivnak is a 33 y.o. female who presents for routine immunizations.   She denies any symptoms , reactions or allergies that would exclude them from being immunized today.  Risks and adverse reactions were discussed and the VIS was given to them. All questions were addressed.  She was observed for15 min post injection. There were no reactions observed.    SHILITA B CARPENTER, LPN

## 2014-05-27 LAB — NUSWAB VAGINITIS
Atopobium vaginae: HIGH Score — AB
BVAB 2: HIGH Score — AB
C. albicans, NAA: POSITIVE — AB
C. glabrata, NAA: NEGATIVE
Megasphaera 1: HIGH Score — AB
T. vaginalis, NAA: POSITIVE — AB

## 2014-05-27 MED ORDER — METRONIDAZOLE 500 MG TAB
500 mg | ORAL_TABLET | ORAL | Status: DC
Start: 2014-05-27 — End: 2014-06-03

## 2014-05-27 MED ORDER — FLUCONAZOLE 150 MG TAB
150 mg | ORAL_TABLET | Freq: Every day | ORAL | Status: AC
Start: 2014-05-27 — End: 2014-05-28

## 2014-05-29 LAB — PAP (IMAGE GUIDED), LIQUID-BASED: .: 0

## 2014-05-29 LAB — PAP (IMAGE GUIDED), LIQUID-BASED (193000): LABCORP 019018: 0

## 2014-06-03 DIAGNOSIS — B349 Viral infection, unspecified: Secondary | ICD-10-CM

## 2014-06-03 NOTE — ED Notes (Signed)
Patient presents to the emergency department with family reporting she awoke from a nap and had generalized body aches, nausea, vomiting, diarrhea, headache, and chills.

## 2014-06-04 ENCOUNTER — Inpatient Hospital Stay
Admit: 2014-06-04 | Discharge: 2014-06-04 | Disposition: A | Discharge status: Home / Self Care | Payer: MEDICAID | Attending: Emergency Medicine

## 2014-06-04 LAB — URINALYSIS W/ REFLEX CULTURE
Bacteria: NEGATIVE /hpf
Bilirubin: NEGATIVE
Blood: NEGATIVE
Glucose: NEGATIVE mg/dL
Leukocyte Esterase: NEGATIVE
Nitrites: NEGATIVE
Protein: NEGATIVE mg/dL
Specific gravity: >1.03 — ABNORMAL HIGH (ref 1.003–1.030)
Urobilinogen: 0.2 EU/dL (ref 0.2–1.0)
pH (UA): 6 (ref 5.0–8.0)

## 2014-06-04 LAB — METABOLIC PANEL, COMPREHENSIVE
A-G Ratio: 0.8 — ABNORMAL LOW (ref 1.1–2.2)
ALT (SGPT): 24 U/L (ref 12–78)
AST (SGOT): 12 U/L — ABNORMAL LOW (ref 15–37)
Albumin: 3.8 g/dL (ref 3.5–5.0)
Alk. phosphatase: 86 U/L (ref 45–117)
Anion gap: 8 mmol/L (ref 5–15)
BUN/Creatinine ratio: 11 — ABNORMAL LOW (ref 12–20)
BUN: 10 MG/DL (ref 6–20)
Bilirubin, total: 0.3 MG/DL (ref 0.2–1.0)
CO2: 26 mmol/L (ref 21–32)
Calcium: 8.8 MG/DL (ref 8.5–10.1)
Chloride: 102 mmol/L (ref 97–108)
Creatinine: 0.88 MG/DL (ref 0.55–1.02)
GFR est AA: >60 mL/min/{1.73_m2} (ref >60)
GFR est non-AA: >60 mL/min/{1.73_m2} (ref >60)
Globulin: 4.5 g/dL — ABNORMAL HIGH (ref 2.0–4.0)
Glucose: 91 mg/dL (ref 65–100)
Potassium: 3.8 mmol/L (ref 3.5–5.1)
Protein, total: 8.3 g/dL — ABNORMAL HIGH (ref 6.4–8.2)
Sodium: 136 mmol/L (ref 136–145)

## 2014-06-04 LAB — CBC WITH AUTOMATED DIFF
ABS. BASOPHILS: 0 10*3/uL (ref 0.0–0.1)
ABS. EOSINOPHILS: 0.1 10*3/uL (ref 0.0–0.4)
ABS. LYMPHOCYTES: 1.1 10*3/uL (ref 0.8–3.5)
ABS. MONOCYTES: 1 10*3/uL (ref 0.0–1.0)
ABS. NEUTROPHILS: 10 10*3/uL — ABNORMAL HIGH (ref 1.8–8.0)
BASOPHILS: 0 % (ref 0–1)
EOSINOPHILS: 1 % (ref 0–7)
HCT: 35.6 % (ref 35.0–47.0)
HGB: 12.4 g/dL (ref 11.5–16.0)
LYMPHOCYTES: 9 % — ABNORMAL LOW (ref 12–49)
MCH: 32.4 PG (ref 26.0–34.0)
MCHC: 34.8 g/dL (ref 30.0–36.5)
MCV: 93 FL (ref 80.0–99.0)
MONOCYTES: 9 % (ref 5–13)
NEUTROPHILS: 81 % — ABNORMAL HIGH (ref 32–75)
PLATELET: 207 10*3/uL (ref 150–400)
RBC: 3.83 M/uL (ref 3.80–5.20)
RDW: 13.3 % (ref 11.5–14.5)
WBC: 12.1 10*3/uL — ABNORMAL HIGH (ref 3.6–11.0)

## 2014-06-04 LAB — HCG URINE, QL. - POC: Pregnancy test,urine (POC): NEGATIVE

## 2014-06-04 LAB — LIPASE: Lipase: 76 U/L (ref 73–393)

## 2014-06-04 LAB — POC GROUP A STREP: Group A strep (POC): NEGATIVE

## 2014-06-04 LAB — INFLUENZA A & B AG (RAPID TEST)
Influenza A Antigen: NEGATIVE
Influenza B Antigen: NEGATIVE

## 2014-06-04 MED ORDER — ONDANSETRON (PF) 4 MG/2 ML INJECTION
4 mg/2 mL | INTRAMUSCULAR | Status: AC
Start: 2014-06-04 — End: 2014-06-03
  Administered 2014-06-04: 04:00:00 via INTRAVENOUS

## 2014-06-04 MED ORDER — SODIUM CHLORIDE 0.9% BOLUS IV
0.9 % | Freq: Once | INTRAVENOUS | Status: AC
Start: 2014-06-04 — End: 2014-06-04
  Administered 2014-06-04: 04:00:00 via INTRAVENOUS

## 2014-06-04 MED ORDER — KETOROLAC TROMETHAMINE 30 MG/ML INJECTION
30 mg/mL (1 mL) | INTRAMUSCULAR | Status: AC
Start: 2014-06-04 — End: 2014-06-03
  Administered 2014-06-04: 04:00:00 via INTRAVENOUS

## 2014-06-04 MED ORDER — ONDANSETRON 4 MG TAB, RAPID DISSOLVE
4 mg | ORAL_TABLET | Freq: Three times a day (TID) | ORAL | Status: AC | PRN
Start: 2014-06-04 — End: ?

## 2014-06-04 MED FILL — KETOROLAC TROMETHAMINE 30 MG/ML INJECTION: 30 mg/mL (1 mL) | INTRAMUSCULAR | Qty: 1

## 2014-06-04 MED FILL — ONDANSETRON (PF) 4 MG/2 ML INJECTION: 4 mg/2 mL | INTRAMUSCULAR | Qty: 2

## 2014-06-04 MED FILL — SODIUM CHLORIDE 0.9 % IV: INTRAVENOUS | Qty: 500

## 2014-06-04 NOTE — ED Provider Notes (Signed)
HPI Comments: 33 y/o female presents to the ED with a chief complaint of myalgias, sore throat, nausea with 1 episode of vomiting, loose stools, chills, subjective fever - states she felt warm but did not take her temperature. Patient states that she took a nap this evening, she felt fine prior to her nap and then when she woke up she had onset of symptoms. She vomited once. She took no medication prior to arrival. She also notes nasal congestion, headache. She has had no known ill contacts. No dysuria.      Past Medical History:    Bipolar depression ([place])                                      History reviewed. No pertinent surgical history.    SocHx: non smoker    Patient is a 33 y.o. female presenting with general illness, headaches, and diarrhea.   Generalized Body Aches  Associated symptoms include headaches. Pertinent negatives include no chest pain and no shortness of breath.   Headache   Associated symptoms include a fever and vomiting. Pertinent negatives include no shortness of breath.   Diarrhea   Associated symptoms include a fever, diarrhea, vomiting, headaches and myalgias. Pertinent negatives include no dysuria and no chest pain.        Past Medical History:   Diagnosis Date   ??? Bipolar depression ([place])        No past surgical history on file.      Family History:   Problem Relation Age of Onset   ??? Hypertension Mother    ??? Hypertension Paternal Aunt    ??? Hypertension Paternal Uncle    ??? Hypertension Maternal Grandmother        History     Social History   ??? Marital Status: SINGLE     Spouse Name: N/A     Number of Children: N/A   ??? Years of Education: N/A     Occupational History   ??? Not on file.     Social History Main Topics   ??? Smoking status: Never Smoker    ??? Smokeless tobacco: Not on file   ??? Alcohol Use: No   ??? Drug Use: Yes   ??? Sexual Activity: Not on file     Other Topics Concern   ??? Not on file     Social History Narrative                 ALLERGIES: Review of patient's allergies indicates no known allergies.      Review of Systems   Constitutional: Positive for fever and chills.   HENT: Positive for congestion and sore throat.    Respiratory: Positive for cough. Negative for shortness of breath.    Cardiovascular: Negative for chest pain.   Gastrointestinal: Positive for vomiting and diarrhea.   Genitourinary: Negative for dysuria.   Musculoskeletal: Positive for myalgias.   Skin: Negative for rash.   Allergic/Immunologic: Negative for immunocompromised state.   Neurological: Positive for headaches.   10 systems reviewed and are negative except as indicated in the HPI    Filed Vitals:    06/03/14 2221 06/04/14 0035 06/04/14 0126   BP: 119/73 118/74 115/73   Pulse: 116 107 99   Temp: 98.4 ??F (36.9 ??C) 99.3 ??F (37.4 ??C) 99.2 ??F (37.3 ??C)   Resp: _0 Height: _1  (  1.626 m)     Weight: 77.111 kg (170 lb)     SpO2: 94% 98% 99%            Physical Exam   Constitutional: She is oriented to person, place, and time. She appears well-developed and well-nourished. No distress.   HENT:   Head: Normocephalic and atraumatic.   Mouth/Throat: Oropharynx is clear and moist. No oropharyngeal exudate.   Eyes: Conjunctivae and EOM are normal. Pupils are equal, round, and reactive to light. Right eye exhibits no discharge. Left eye exhibits no discharge.   Neck: Normal range of motion. Neck supple.   Easily touches chin to chest, no meningismus   Cardiovascular: Normal rate, regular rhythm and normal heart sounds.  Exam reveals no gallop and no friction rub.    No murmur heard.  Pulmonary/Chest: Effort normal and breath sounds normal. No respiratory distress. She has no wheezes. She has no rales.   Good air movement  cta-b  No cough noted   Abdominal: Soft. She exhibits no distension. There is no tenderness. There is no rebound and no guarding.   Musculoskeletal: Normal range of motion. She exhibits no edema or tenderness.    Neurological: She is alert and oriented to person, place, and time. She displays no atrophy and no tremor. No cranial nerve deficit. She exhibits normal muscle tone. She displays no seizure activity. Coordination normal. GCS eye subscore is 4. GCS verbal subscore is 5. GCS motor subscore is 6.   Skin: Skin is warm and dry. She is not diaphoretic.   Psychiatric: She has a normal mood and affect. Her behavior is normal. Judgment and thought content normal.   Nursing note and vitals reviewed.       MDM     Pt afebrile, nontoxic, completely supple neck with no meningeal signs; soft and NT abdomen without peritoneal signs; lungs clear and no cough noted - doubt pna; pt given IVF and IV zofran. She is resting comfortably in NAD on reevaluation. Suspect viral process. Supportive care measures discussed. Pt d/w Dr. Bertram Gala. Pt discharged home in stable condition.    Patient Vitals for the past 12 hrs:   Temp Pulse Resp BP SpO2   06/04/14 0126 99.2 ??F (37.3 ??C) 99 17 115/73 mmHg 99 %   06/04/14 0035 99.3 ??F (37.4 ??C) (!) 107 17 118/74 mmHg 98 %   06/03/14 2221 98.4 ??F (36.9 ??C) (!) 116 16 119/73 mmHg 94 %     Recent Results (from the past 12 hour(s))   CBC WITH AUTOMATED DIFF    Collection Time: 06/03/14 11:01 PM   Result Value Ref Range    WBC 12.1 (H) 3.6 - 11.0 K/uL    RBC 3.83 3.80 - 5.20 M/uL    HGB 12.4 11.5 - 16.0 g/dL    HCT 35.6 35.0 - 47.0 %    MCV 93.0 80.0 - 99.0 FL    MCH 32.4 26.0 - 34.0 PG    MCHC 34.8 30.0 - 36.5 g/dL    RDW 13.3 11.5 - 14.5 %    PLATELET 207 150 - 400 K/uL    NEUTROPHILS 81 (H) 32 - 75 %    LYMPHOCYTES 9 (L) 12 - 49 %    MONOCYTES 9 5 - 13 %    EOSINOPHILS 1 0 - 7 %    BASOPHILS 0 0 - 1 %    ABS. NEUTROPHILS 10.0 (H) 1.8 - 8.0 K/UL    ABS. LYMPHOCYTES 1.1 0.8 -  3.5 K/UL    ABS. MONOCYTES 1.0 0.0 - 1.0 K/UL    ABS. EOSINOPHILS 0.1 0.0 - 0.4 K/UL    ABS. BASOPHILS 0.0 0.0 - 0.1 K/UL   METABOLIC PANEL, COMPREHENSIVE    Collection Time: 06/03/14 11:01 PM   Result Value Ref Range     Sodium 136 136 - 145 mmol/L    Potassium 3.8 3.5 - 5.1 mmol/L    Chloride 102 97 - 108 mmol/L    CO2 26 21 - 32 mmol/L    Anion gap 8 5 - 15 mmol/L    Glucose 91 65 - 100 mg/dL    BUN 10 6 - 20 MG/DL    Creatinine 0.88 0.55 - 1.02 MG/DL    BUN/Creatinine ratio 11 (L) 12 - 20      GFR est AA >60 >60 ml/min/1.42m    GFR est non-AA >60 >60 ml/min/1.763m   Calcium 8.8 8.5 - 10.1 MG/DL    Bilirubin, total 0.3 0.2 - 1.0 MG/DL    ALT 24 12 - 78 U/L    AST 12 (L) 15 - 37 U/L    Alk. phosphatase 86 45 - 117 U/L    Protein, total 8.3 (H) 6.4 - 8.2 g/dL    Albumin 3.8 3.5 - 5.0 g/dL    Globulin 4.5 (H) 2.0 - 4.0 g/dL    A-G Ratio 0.8 (L) 1.1 - 2.2     LIPASE    Collection Time: 06/03/14 11:01 PM   Result Value Ref Range    Lipase 76 73 - 393 U/L   INFLUENZA A & B AG (RAPID TEST)    Collection Time: 06/03/14 11:01 PM   Result Value Ref Range    Influenza A Antigen NEGATIVE  NEG      Influenza B Antigen NEGATIVE  NEG     HCG URINE, QL. - POC    Collection Time: 06/03/14 11:15 PM   Result Value Ref Range    Pregnancy test,urine (POC) NEGATIVE  NEG     POC GROUP A STREP    Collection Time: 06/03/14 11:16 PM   Result Value Ref Range    Group A strep (POC) NEGATIVE  NEG     URINALYSIS W/ REFLEX CULTURE    Collection Time: 06/04/14 12:32 AM   Result Value Ref Range    Color YELLOW/STRAW      Appearance CLOUDY (A) CLEAR      Specific gravity >1.030 (H) 1.003 - 1.030    pH (UA) 6.0 5.0 - 8.0      Protein NEGATIVE  NEG mg/dL    Glucose NEGATIVE  NEG mg/dL    Ketone TRACE (A) NEG mg/dL    Bilirubin NEGATIVE  NEG      Blood NEGATIVE  NEG      Urobilinogen 0.2 0.2 - 1.0 EU/dL    Nitrites NEGATIVE  NEG      Leukocyte Esterase NEGATIVE  NEG      WBC 0-4 0 - 4 /hpf    RBC 0-5 0 - 5 /hpf    Epithelial cells FEW FEW /lpf    Bacteria NEGATIVE  NEG /hpf    UA:UC IF INDICATED CULTURE NOT INDICATED BY UA RESULT CNI         Procedures

## 2014-06-06 LAB — CULTURE, THROAT: Culture result:: NORMAL

## 2014-06-11 NOTE — Telephone Encounter (Signed)
Contacted patient's insurance company in reference to formulary alternative for Midrin, no other medication was found in this class, but the insurance will cover Imitrex Nasal Spray, Imitrex tablets, Rizatriptan and Naratriptan for migraines.

## 2020-01-20 ENCOUNTER — Other Ambulatory Visit: Payer: Self-pay

## 2020-01-20 ENCOUNTER — Ambulatory Visit (HOSPITAL_COMMUNITY)
Admission: EM | Admit: 2020-01-20 | Discharge: 2020-01-20 | Disposition: A | Discharge status: Home / Self Care | Payer: Medicaid Other | Attending: Family Medicine | Admitting: Family Medicine

## 2020-01-20 ENCOUNTER — Encounter (HOSPITAL_COMMUNITY): Payer: Self-pay | Admitting: Emergency Medicine

## 2020-01-20 DIAGNOSIS — M79602 Pain in left arm: Secondary | ICD-10-CM

## 2020-01-20 HISTORY — DX: Herpesviral infection, unspecified: B00.9

## 2020-01-20 HISTORY — DX: Syphilis, unspecified: A53.9

## 2020-01-20 MED ORDER — ACYCLOVIR 400 MG PO TABS: 400.0000 mg | ORAL_TABLET | Freq: Four times a day (QID) | ORAL | 0 refills | Status: AC

## 2020-01-20 MED ORDER — TIZANIDINE HCL 4 MG PO TABS
4.0000 mg | ORAL_TABLET | Freq: Four times a day (QID) | ORAL | 0 refills | Status: AC | PRN
Start: 2020-01-20 — End: ?

## 2020-01-20 NOTE — ED Triage Notes (Signed)
Arm pain for 3 days.  Patient mentions having covid shot in left arm 1 1/2 weeks ago.    Patient just wants to make sure everything is ok.  Patient does report a history of joint inflammation in elbow, wrist, etc.  Over the years.    Patient does not have a provider.

## 2020-01-20 NOTE — Discharge Instructions (Signed)
May take Tylenol or ibuprofen for pain Take the tizanidine as needed muscle relaxer Use ice or heat to painful neck muscles Acyclovir refill

## 2020-01-20 NOTE — ED Provider Notes (Signed)
[facility]    CSN: [id number] Arrival date & time: 01/20/20  1818      History   Chief Complaint Chief Complaint  Patient presents with  . Arm Pain    HPI Morgan Mathis is a 39 y.o. female.   HPI   Pain in left arm after her first Covid shot She also has a history of cervical disc disease and is having pain from her neck down into the index finger She wonders whether there pinched nerve is causing the pain or she is having a reaction to the Covid shot She had no redness or warmth in the area  States she is new to the area would like a refill of her acyclovir.  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 Herpes   . Syphilis     There are no problems to display for this patient.   Past Surgical History:  Procedure Laterality Date  . CARPAL TUNNEL RELEASE Right   . TUBAL LIGATION      OB History   No obstetric history on file.      Home Medications    Prior to Admission medications   Medication Sig Start Date End Date Taking? Authorizing Provider  acyclovir (ZOVIRAX) 400 MG tablet Take 1 tablet (400 mg total) by mouth 4 (four) times daily. 01/20/20   Eustace Moore, MD  tiZANidine (ZANAFLEX) 4 MG tablet Take 1-2 tablets (4-8 mg total) by mouth every 6 (six) hours as needed for muscle spasms. 01/20/20   Eustace Moore, MD    Family History Family History  Problem Relation Age of Onset  . Hypertension Mother   . Hypertension Father     Social History Social History   Tobacco Use  . Smoking status: Current Every Day Smoker  Substance Use Topics  . Alcohol use: Yes  . Drug use: Yes    Types: Marijuana    Comment: crack in the past week (01/20/2020)     Allergies   Patient has no known allergies.   Review of Systems Review of Systems See HPI  Physical Exam Triage Vital Signs ED Triage Vitals  Enc Vitals Group     BP 01/20/20 1925 105/70     Pulse Rate 01/20/20 1925 95      Resp 01/20/20 1925 18     Temp 01/20/20 1925 98.6 F (37 C)     Temp Source 01/20/20 1925 Oral     SpO2 01/20/20 1925 99 %     Weight --      Height --      Head Circumference --      Peak Flow --      Pain Score 01/20/20 1920 8     Pain Loc --      Pain Edu? --      Excl. in GC? --    No data found.  Updated Vital Signs BP 105/70 (BP Location: Right Arm)   Pulse 95   Temp 98.6 F (37 C) (Oral)   Resp 18   LMP 01/09/2020   SpO2 99%   Visual Acuity Right Eye Distance:   Left Eye Distance:   Bilateral Distance:    Right Eye Near:   Left Eye Near:    Bilateral Near:     Physical Exam Constitutional:      General: She is not in acute distress.    Appearance: She is well-developed and normal weight.  HENT:     Head: Normocephalic and atraumatic.     Mouth/Throat:     Comments: Mask is in place Eyes:     Conjunctiva/sclera: Conjunctivae normal.     Pupils: Pupils are equal, round, and reactive to light.  Cardiovascular:     Rate and Rhythm: Normal rate.  Pulmonary:     Effort: Pulmonary effort is normal. No respiratory distress.  Musculoskeletal:        General: Normal range of motion.     Cervical back: Normal range of motion.     Comments: Neck has full range of motion.  Mild tenderness in the upper body left trapezius and into the left of the lower cervical spine.  No spinous tenderness.  The left arm is examined.  At 90 degrees of abduction the patient does have some limitation in her internal and external rotation.  The deltoid muscle, is of normal texture with no mass.  There is no warmth.  No induration.  Strength sensation range of motion reflexes are intact in both upper extremities  Skin:    General: Skin is warm and dry.  Neurological:     Mental Status: She is alert.  Psychiatric:        Mood and Affect: Mood normal.        Behavior: Behavior normal.      UC Treatments / Results  Labs (all labs ordered are listed, but only abnormal results are  displayed) Labs Reviewed - No data to display  EKG   Radiology No results found.  Procedures Procedures (including critical care time)  Medications Ordered in UC Medications - No data to display  Initial Impression / Assessment and Plan / UC Course  I have reviewed the triage vital signs and the nursing notes.  Pertinent labs & imaging results that were available during my care of the patient were reviewed by me and considered in my medical decision making (see chart for details).     I explained to the patient she was not having a reaction to her Covid shot.  It is important she proceed with her second shot.  She does have some musculoskeletal neck pain which may be causing her arm pain.  She can be treated with over-the-counter ibuprofen and tizanidine as a muscle relaxer.  I refilled a limited number of acyclovir.  She needs to establish with a PCP Final Clinical Impressions(s) / UC Diagnoses   Final diagnoses:  Left arm pain     Discharge Instructions     May take Tylenol or ibuprofen for pain Take the tizanidine as needed muscle relaxer Use ice or heat to painful neck muscles Acyclovir refill   ED Prescriptions    Medication Sig Dispense Auth. Provider   tiZANidine (ZANAFLEX) 4 MG tablet Take 1-2 tablets (4-8 mg total) by mouth every 6 (six) hours as needed for muscle spasms. 21 tablet Eustace Moore, MD   acyclovir (ZOVIRAX) 400 MG tablet Take 1 tablet (400 mg total) by mouth 4 (four) times daily. 50 tablet Eustace Moore, MD     PDMP not reviewed this encounter.   Eustace Moore, MD 01/20/20 2154
